# Patient Record
Sex: Male | Born: 1975
Health system: Southern US, Community
[De-identification: ages and names within clinical notes are randomized; demographics above are authoritative.]

## PROBLEM LIST (undated history)

## (undated) HISTORY — PX: ANKLE SURGERY: SHX546

## (undated) HISTORY — PX: RETINAL DETACHMENT SURGERY: SHX105

## (undated) HISTORY — PX: HERNIA REPAIR: SHX51

## (undated) HISTORY — PX: APPENDECTOMY: SHX54

## (undated) HISTORY — PX: ELBOW FRACTURE SURGERY: SHX616

---

## 1999-10-18 ENCOUNTER — Emergency Department (HOSPITAL_COMMUNITY): Admission: EM | Admit: 1999-10-18 | Discharge: 1999-10-18 | Payer: Self-pay | Admitting: Emergency Medicine

## 2007-06-12 ENCOUNTER — Emergency Department (HOSPITAL_COMMUNITY): Admission: EM | Admit: 2007-06-12 | Discharge: 2007-06-12 | Payer: Self-pay | Admitting: Family Medicine

## 2007-08-08 ENCOUNTER — Ambulatory Visit (HOSPITAL_COMMUNITY): Admission: RE | Admit: 2007-08-08 | Discharge: 2007-08-08 | Payer: Self-pay | Admitting: Sports Medicine

## 2008-04-27 ENCOUNTER — Emergency Department (HOSPITAL_COMMUNITY): Admission: EM | Admit: 2008-04-27 | Discharge: 2008-04-27 | Payer: Self-pay | Admitting: Emergency Medicine

## 2010-04-19 ENCOUNTER — Encounter: Payer: Self-pay | Admitting: Sports Medicine

## 2010-06-05 IMAGING — CR DG FINGER INDEX 2+V*R*
1 series · 1 of 1 positions shown · non-contrast
Comparison: None

CLINICAL DATA: Finger swelling and warmth following splinter
removal yesterday.

RIGHT INDEX FINGER 2+V

[view not recorded]
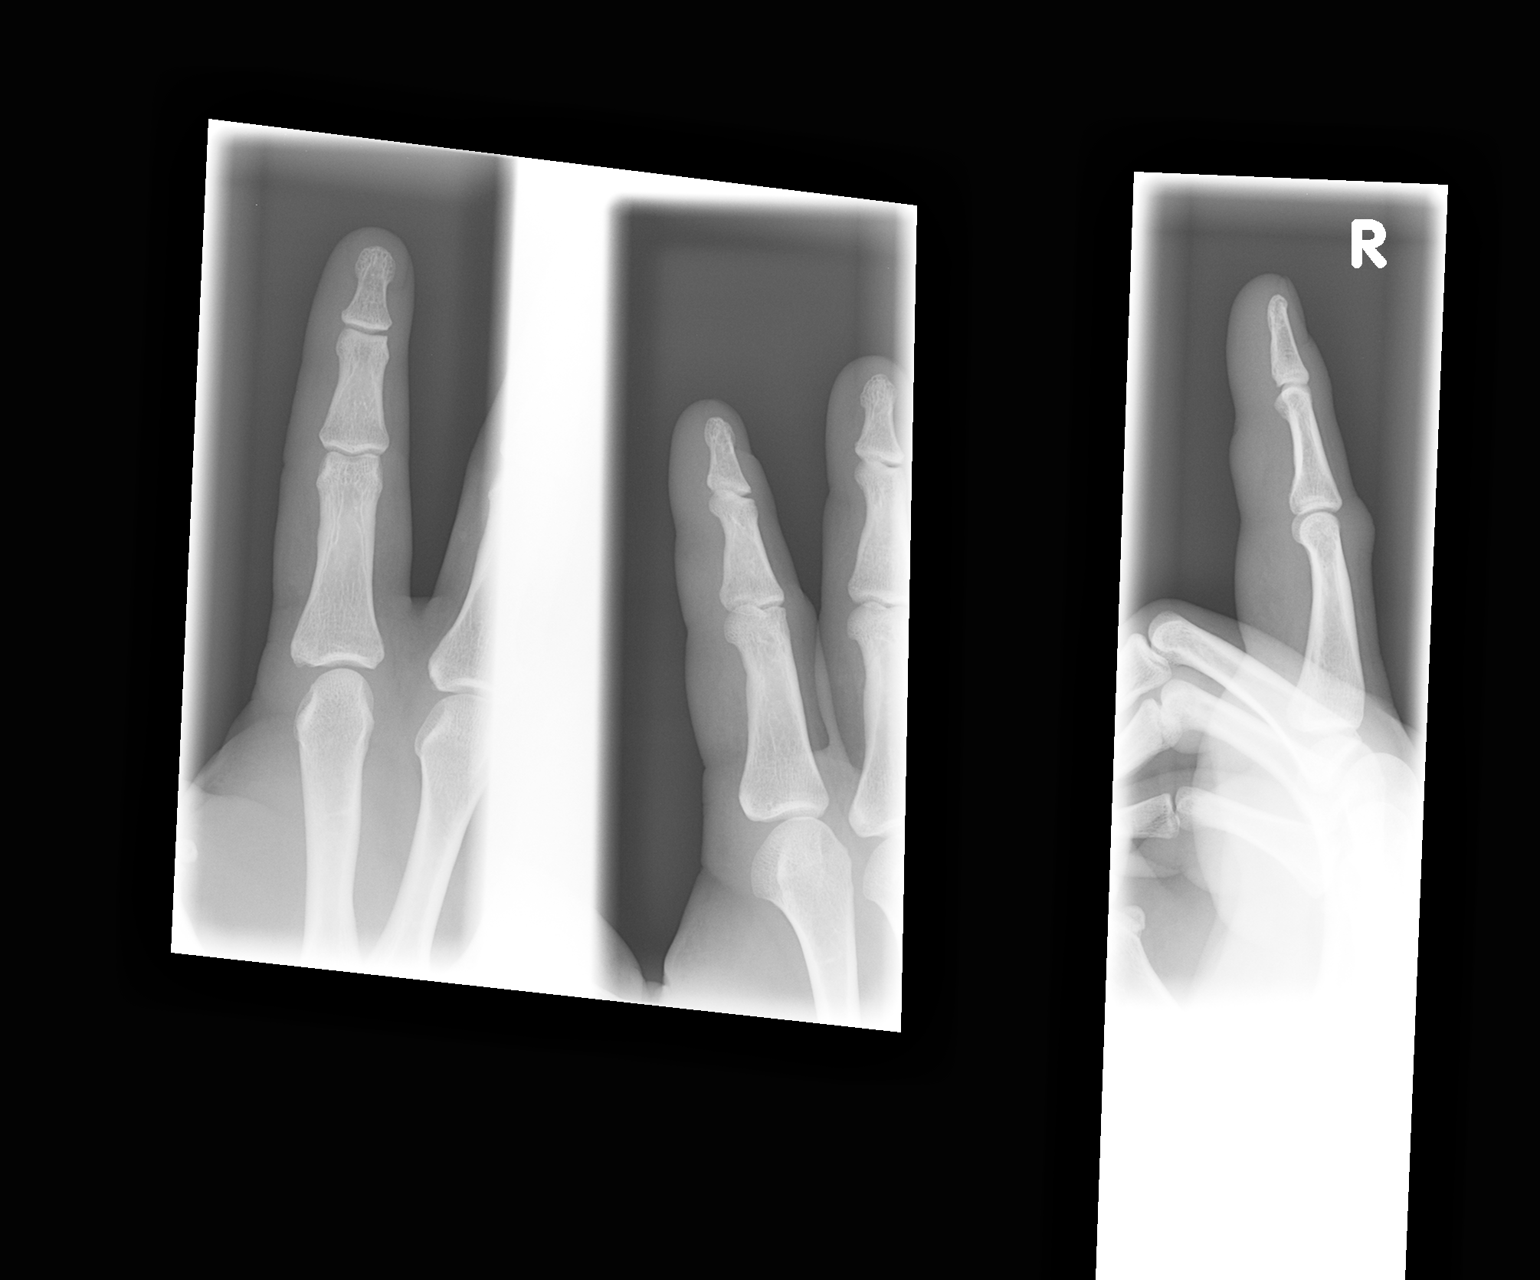

[1 of 1 positions shown; findings below may reference images not displayed]

FINDINGS: There is diffuse soft tissue swelling, especially
proximally.  No foreign body, soft tissue emphysema or bone
destruction is identified.  There is no evidence of acute fracture
or dislocation.
IMPRESSION: Soft tissue swelling without evidence of residual foreign body or
osteomyelitis.  Soft tissue infection is not excluded.

## 2015-07-15 DIAGNOSIS — Z9109 Other allergy status, other than to drugs and biological substances: Secondary | ICD-10-CM | POA: Diagnosis not present

## 2015-07-15 DIAGNOSIS — R51 Headache: Secondary | ICD-10-CM | POA: Diagnosis not present

## 2015-07-15 DIAGNOSIS — M542 Cervicalgia: Secondary | ICD-10-CM | POA: Diagnosis not present

## 2015-07-18 DIAGNOSIS — R51 Headache: Secondary | ICD-10-CM | POA: Diagnosis not present

## 2016-01-28 DIAGNOSIS — H25812 Combined forms of age-related cataract, left eye: Secondary | ICD-10-CM | POA: Diagnosis not present

## 2016-01-28 DIAGNOSIS — H04123 Dry eye syndrome of bilateral lacrimal glands: Secondary | ICD-10-CM | POA: Diagnosis not present

## 2016-01-28 DIAGNOSIS — H25041 Posterior subcapsular polar age-related cataract, right eye: Secondary | ICD-10-CM | POA: Diagnosis not present

## 2016-08-30 DIAGNOSIS — S80862A Insect bite (nonvenomous), left lower leg, initial encounter: Secondary | ICD-10-CM | POA: Diagnosis not present

## 2016-08-30 DIAGNOSIS — R21 Rash and other nonspecific skin eruption: Secondary | ICD-10-CM | POA: Diagnosis not present

## 2016-09-06 DIAGNOSIS — L301 Dyshidrosis [pompholyx]: Secondary | ICD-10-CM | POA: Diagnosis not present

## 2016-09-22 DIAGNOSIS — H9201 Otalgia, right ear: Secondary | ICD-10-CM | POA: Diagnosis not present

## 2017-02-02 DIAGNOSIS — H25041 Posterior subcapsular polar age-related cataract, right eye: Secondary | ICD-10-CM | POA: Diagnosis not present

## 2017-02-02 DIAGNOSIS — H04123 Dry eye syndrome of bilateral lacrimal glands: Secondary | ICD-10-CM | POA: Diagnosis not present

## 2017-02-02 DIAGNOSIS — H25812 Combined forms of age-related cataract, left eye: Secondary | ICD-10-CM | POA: Diagnosis not present

## 2017-03-01 DIAGNOSIS — J0120 Acute ethmoidal sinusitis, unspecified: Secondary | ICD-10-CM | POA: Diagnosis not present

## 2017-05-02 DIAGNOSIS — Z0000 Encounter for general adult medical examination without abnormal findings: Secondary | ICD-10-CM | POA: Diagnosis not present

## 2017-05-02 DIAGNOSIS — E663 Overweight: Secondary | ICD-10-CM | POA: Diagnosis not present

## 2017-05-02 DIAGNOSIS — R140 Abdominal distension (gaseous): Secondary | ICD-10-CM | POA: Diagnosis not present

## 2017-05-02 DIAGNOSIS — Z13220 Encounter for screening for lipoid disorders: Secondary | ICD-10-CM | POA: Diagnosis not present

## 2017-05-02 DIAGNOSIS — Z23 Encounter for immunization: Secondary | ICD-10-CM | POA: Diagnosis not present

## 2017-05-02 DIAGNOSIS — G479 Sleep disorder, unspecified: Secondary | ICD-10-CM | POA: Diagnosis not present

## 2018-01-27 DIAGNOSIS — K118 Other diseases of salivary glands: Secondary | ICD-10-CM | POA: Diagnosis not present

## 2018-01-28 ENCOUNTER — Emergency Department (HOSPITAL_COMMUNITY)
Admission: EM | Admit: 2018-01-28 | Discharge: 2018-01-28 | Disposition: A | Discharge status: Home / Self Care | Payer: BLUE CROSS/BLUE SHIELD | Attending: Emergency Medicine | Admitting: Emergency Medicine

## 2018-01-28 ENCOUNTER — Emergency Department (HOSPITAL_COMMUNITY): Payer: BLUE CROSS/BLUE SHIELD

## 2018-01-28 ENCOUNTER — Encounter (HOSPITAL_COMMUNITY): Payer: Self-pay | Admitting: Emergency Medicine

## 2018-01-28 DIAGNOSIS — Z87891 Personal history of nicotine dependence: Secondary | ICD-10-CM | POA: Insufficient documentation

## 2018-01-28 DIAGNOSIS — K1120 Sialoadenitis, unspecified: Secondary | ICD-10-CM | POA: Insufficient documentation

## 2018-01-28 DIAGNOSIS — R600 Localized edema: Secondary | ICD-10-CM | POA: Diagnosis not present

## 2018-01-28 DIAGNOSIS — K118 Other diseases of salivary glands: Secondary | ICD-10-CM | POA: Diagnosis not present

## 2018-01-28 LAB — CBC WITH DIFFERENTIAL/PLATELET
ABS IMMATURE GRANULOCYTES: 0.04 10*3/uL (ref 0.00–0.07)
BASOS PCT: 0 %
Basophils Absolute: 0 10*3/uL (ref 0.0–0.1)
EOS ABS: 0.3 10*3/uL (ref 0.0–0.5)
Eosinophils Relative: 3 %
HEMATOCRIT: 45 % (ref 39.0–52.0)
Hemoglobin: 14.1 g/dL (ref 13.0–17.0)
IMMATURE GRANULOCYTES: 0 %
LYMPHS ABS: 2.1 10*3/uL (ref 0.7–4.0)
Lymphocytes Relative: 19 %
MCH: 29.2 pg (ref 26.0–34.0)
MCHC: 31.3 g/dL (ref 30.0–36.0)
MCV: 93.2 fL (ref 80.0–100.0)
MONO ABS: 1.2 10*3/uL — AB (ref 0.1–1.0)
MONOS PCT: 11 %
NEUTROS ABS: 7.3 10*3/uL (ref 1.7–7.7)
NEUTROS PCT: 67 %
Platelets: 221 10*3/uL (ref 150–400)
RBC: 4.83 MIL/uL (ref 4.22–5.81)
RDW: 11.6 % (ref 11.5–15.5)
WBC: 11 10*3/uL — ABNORMAL HIGH (ref 4.0–10.5)
nRBC: 0 % (ref 0.0–0.2)

## 2018-01-28 LAB — BASIC METABOLIC PANEL
ANION GAP: 7 (ref 5–15)
BUN: 11 mg/dL (ref 6–20)
CHLORIDE: 108 mmol/L (ref 98–111)
CO2: 26 mmol/L (ref 22–32)
Calcium: 8.7 mg/dL — ABNORMAL LOW (ref 8.9–10.3)
Creatinine, Ser: 0.9 mg/dL (ref 0.61–1.24)
GFR calc Af Amer: >60 mL/min (ref >60)
GFR calc non Af Amer: >60 mL/min (ref >60)
GLUCOSE: 94 mg/dL (ref 70–99)
POTASSIUM: 3.4 mmol/L — AB (ref 3.5–5.1)
Sodium: 141 mmol/L (ref 135–145)

## 2018-01-28 MED ORDER — KETOROLAC TROMETHAMINE 15 MG/ML IJ SOLN
15.0000 mg | Freq: Once | INTRAMUSCULAR | Status: AC
  Administered 2018-01-28: 15 mg via INTRAVENOUS
  Filled 2018-01-28: qty 1

## 2018-01-28 MED ORDER — IOHEXOL 300 MG/ML  SOLN
75.0000 mL | Freq: Once | INTRAMUSCULAR | Status: AC | PRN
  Administered 2018-01-28: 75 mL via INTRAVENOUS

## 2018-01-28 MED ORDER — CEPHALEXIN 500 MG PO CAPS: 500.0000 mg | ORAL_CAPSULE | Freq: Four times a day (QID) | ORAL | 0 refills | Status: AC

## 2018-01-28 NOTE — ED Notes (Signed)
The pt is c/o pain in lt jaw just under since Wednesday.  He saw his doctor and was told it was a  Stone in one of the glands

## 2018-01-28 NOTE — ED Notes (Signed)
Pt returns room radiology.

## 2018-01-28 NOTE — ED Triage Notes (Signed)
Reports being seen by family physician on Friday and was told possible blocked salivary gland on left side.  Continues to have swelling and pain.

## 2018-01-28 NOTE — Discharge Instructions (Signed)
Continue to suck on sour candy place warm rags or heating pad on the area and massage it.  It would be important for you to follow-up with ENT or your regular doctor by Tuesday or Wednesday of next week or if it is not getting any better.  If it starts to get worse you start running a high temperature return to the emergency room

## 2018-01-28 NOTE — ED Provider Notes (Signed)
[facility] EMERGENCY DEPARTMENT Provider Note   CSN: [id number] Arrival date & time: 01/28/18  7829     History   Chief Complaint Chief Complaint  Patient presents with  . Facial Swelling    HPI Cody Neal is a 42 y.o. male.  Patient is a 42 year old male with no significant past medical history presenting today with worsening swelling and pain in the left side of his neck.  Patient states symptoms started on Wednesday when he noticed a small tender area in his left neck.  It continued to swell and he saw his doctor yesterday and was told that he had a salivary stone or possible parotitis.  Patient denies fever, difficulty breathing or difficulty swallowing.  Swallowing does not make his pain worse.  Patient has not had any recent foreign travel no sick contacts and was immunized as a child.  Patient does admit to using chewing tobacco and smoking intermittently for years but has quit within the last 6 months  The history is provided by the patient.    History reviewed. No pertinent past medical history.  There are no active problems to display for this patient.   Past Surgical History:  Procedure Laterality Date  . ANKLE SURGERY Left   . APPENDECTOMY    . ELBOW FRACTURE SURGERY Left   . HERNIA REPAIR    . RETINAL DETACHMENT SURGERY Left         Home Medications    Prior to Admission medications   Medication Sig Start Date End Date Taking? Authorizing Provider  ibuprofen (ADVIL,MOTRIN) 200 MG tablet Take 200 mg by mouth every 6 (six) hours as needed for mild pain.   Yes [provider]    Family History No family history on file.  Social History Social History   Tobacco Use  . Smoking status: Former Games developer  . Smokeless tobacco: Former Engineer, water Use Topics  . Alcohol use: Yes    Comment: occasionally  . Drug use: Yes    Types: Marijuana     Allergies   Penicillins   Review of Systems Review of Systems  All  other systems reviewed and are negative.    Physical Exam Updated Vital Signs BP (!) 148/83 (BP Location: Right Arm)   Pulse 64   Temp 98.7 F (37.1 C) (Oral)   Resp 18   Ht 6' (1.829 m)   Wt 106.1 kg   SpO2 100%   BMI 31.74 kg/m   Physical Exam  Constitutional: He is oriented to person, place, and time. He appears well-developed and well-nourished. No distress.  HENT:  Head: Normocephalic and atraumatic.  Mouth/Throat: Oropharynx is clear and moist and mucous membranes are normal. No oropharyngeal exudate, posterior oropharyngeal edema or posterior oropharyngeal erythema.    Eyes: Pupils are equal, round, and reactive to light. Conjunctivae and EOM are normal.  Neck: Normal range of motion. Neck supple. No spinous process tenderness present. Normal range of motion present.    Cardiovascular: Normal rate, regular rhythm and intact distal pulses.  No murmur heard. Pulmonary/Chest: Effort normal and breath sounds normal. No respiratory distress. He has no wheezes. He has no rales.  Musculoskeletal: Normal range of motion. He exhibits no edema or tenderness.  Neurological: He is alert and oriented to person, place, and time.  Skin: Skin is warm and dry. No rash noted. No erythema.  Psychiatric: He has a normal mood and affect. His behavior is normal.  Nursing note and vitals reviewed.  ED Treatments / Results  Labs (all labs ordered are listed, but only abnormal results are displayed) Labs Reviewed  CBC WITH DIFFERENTIAL/PLATELET - Abnormal; Notable for the following components:      Result Value   WBC 11.0 (*)    Monocytes Absolute 1.2 (*)    All other components within normal limits  BASIC METABOLIC PANEL - Abnormal; Notable for the following components:   Potassium 3.4 (*)    Calcium 8.7 (*)    All other components within normal limits    EKG None  Radiology Ct Soft Tissue Neck W Contrast  Result Date: 01/28/2018 CLINICAL DATA:  Neck swelling and pain.  Salivary gland obstruction. Neck mass, solitary, afebrile. EXAM: CT NECK WITH CONTRAST TECHNIQUE: Multidetector CT imaging of the neck was performed using the standard protocol following the bolus administration of intravenous contrast. CONTRAST:  75mL OMNIPAQUE IOHEXOL 300 MG/ML  SOLN COMPARISON:  None. FINDINGS: Pharynx and larynx: No focal mucosal or submucosal lesions are present. The nasopharynx is within normal. Soft palate is unremarkable. Tongue base is normal. Epiglottis is within. The hypopharynx is clear. Vocal cords are midline and symmetric. The trachea is unremarkable. Salivary glands: The right parotid gland and bilateral submandibular glands are within limits. Ill-defined inflammatory changes are present at the tail of the left parotid gland. There is no significant duct dilation. There is some stranding and fluid. Accessory parotid tissue is present on the left. Thyroid: Normal. Lymph nodes: Inflammatory left level 2 lymph nodes are present. No significant adenopathy is present. Vascular: Negative. Limited intracranial: Limited imaging of the abdomen is unremarkable. Visualized orbits: Left-sided scleral banding is noted. Globes and orbits are otherwise within normal limits. Mastoids and visualized paranasal sinuses: There is diffuse opacification of ethmoid air cells bilaterally. Mild mucosal thickening is present in the maxillary and sphenoid sinuses bilaterally. Mastoid air cells are clear. Skeleton: No straightening of the normal cervical lordosis. Vertebral body heights alignment are maintained. No focal lytic or blastic lesions are present. Upper chest: The lung apices are clear without focal nodule, mass, or airspace disease. IMPRESSION: 1. Inflammatory changes about the tail of the left parotid gland are nonspecific. No discrete mass lesion or obstructive disease is present. May represent a viral sialadenitis with sequela of recent obstructive disease. 2. Inflammatory lymph nodes are present  without significant adenopathy. 3. Otherwise unremarkable CT of the neck. Electronically Signed   By: Marin Roberts M.D.   On: 01/28/2018 09:57    Procedures Procedures (including critical care time)  Medications Ordered in ED Medications - No data to display   Initial Impression / Assessment and Plan / ED Course  I have reviewed the triage vital signs and the nursing notes.  Pertinent labs & imaging results that were available during my care of the patient were reviewed by me and considered in my medical decision making (see chart for details).     Patient presenting today with a tender swollen mass in the left anterior neck concerning for possible parotitis versus parotid gland stone.  No evidence of salivary stone present.  Patient has no infectious symptoms concerning for mumps.  10:41 AM CT shows sialoadenitis without evidence of obstruction at this time.  Also some minimal surrounding lymph nodes.  Patient has a leukocytosis of 11,000 and otherwise normal labs.  Findings discussed with the patient.  Discussed continued supportive care and will add on Keflex in case of an early bacterial component.  No evidence of mumps at this time.  However could  still be a viral etiology.  Final Clinical Impressions(s) / ED Diagnoses   Final diagnoses:  Sialadenitis    ED Discharge Orders         Ordered    cephALEXin (KEFLEX) 500 MG capsule  4 times daily     01/28/18 1039           Gwyneth Sprout, MD 01/28/18 1041

## 2018-01-28 NOTE — ED Notes (Signed)
Pt stable, ambulatory, states understanding of discharge instructions 

## 2018-01-28 NOTE — ED Notes (Signed)
Patient transported to CT 

## 2018-02-02 DIAGNOSIS — H25041 Posterior subcapsular polar age-related cataract, right eye: Secondary | ICD-10-CM | POA: Diagnosis not present

## 2018-02-02 DIAGNOSIS — H04123 Dry eye syndrome of bilateral lacrimal glands: Secondary | ICD-10-CM | POA: Diagnosis not present

## 2018-02-02 DIAGNOSIS — H25812 Combined forms of age-related cataract, left eye: Secondary | ICD-10-CM | POA: Diagnosis not present

## 2018-06-07 DIAGNOSIS — Z9109 Other allergy status, other than to drugs and biological substances: Secondary | ICD-10-CM | POA: Diagnosis not present

## 2018-06-07 DIAGNOSIS — E663 Overweight: Secondary | ICD-10-CM | POA: Diagnosis not present

## 2018-06-07 DIAGNOSIS — Z0000 Encounter for general adult medical examination without abnormal findings: Secondary | ICD-10-CM | POA: Diagnosis not present

## 2018-06-07 DIAGNOSIS — Z6833 Body mass index (BMI) 33.0-33.9, adult: Secondary | ICD-10-CM | POA: Diagnosis not present

## 2018-09-18 DIAGNOSIS — R3121 Asymptomatic microscopic hematuria: Secondary | ICD-10-CM | POA: Diagnosis not present

## 2018-09-22 DIAGNOSIS — R3121 Asymptomatic microscopic hematuria: Secondary | ICD-10-CM | POA: Diagnosis not present

## 2018-09-22 DIAGNOSIS — N289 Disorder of kidney and ureter, unspecified: Secondary | ICD-10-CM | POA: Diagnosis not present

## 2018-09-28 DIAGNOSIS — N281 Cyst of kidney, acquired: Secondary | ICD-10-CM | POA: Diagnosis not present

## 2018-09-28 DIAGNOSIS — R3121 Asymptomatic microscopic hematuria: Secondary | ICD-10-CM | POA: Diagnosis not present

## 2020-03-07 IMAGING — CT CT NECK W/ CM
3 of 4 series · 13 of 33 positions shown, 16 images · IV contrast (APPLIED)
Comparison: None.

CLINICAL DATA: Neck swelling and pain. Salivary gland obstruction.
Neck mass, solitary, afebrile.

EXAM:
CT NECK WITH CONTRAST
TECHNIQUE: Multidetector CT imaging of the neck was performed using the
standard protocol following the bolus administration of intravenous
contrast.
CONTRAST:  75mL OMNIPAQUE IOHEXOL 300 MG/ML  SOLN

[Series 3: neck 2.0 i31s 3 · axial · 0.64mm/px · z∈[-259,-83]mm · 5 of 133 slices shown, 7 images]
[im 23/133  soft-tissue]
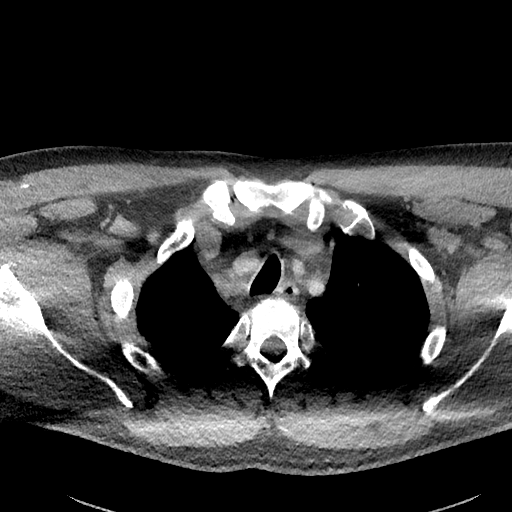
[im 23/133  bone]
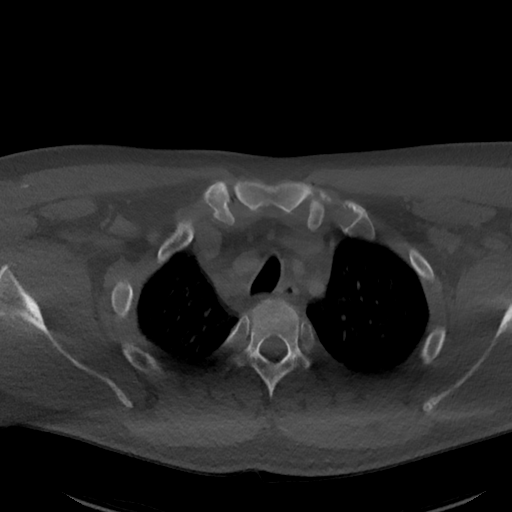
[im 45/133  bone]
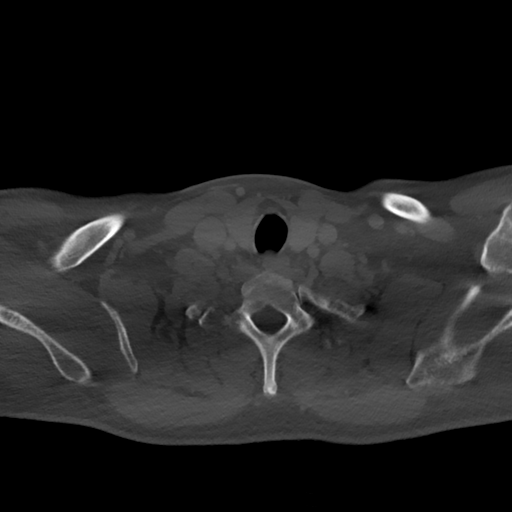
[im 67/133  bone]
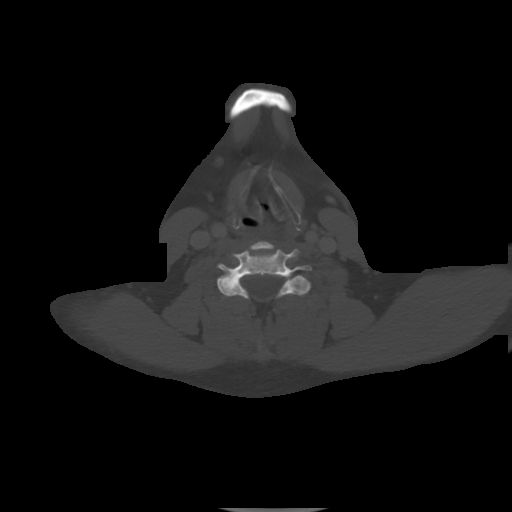
[im 89/133  bone]
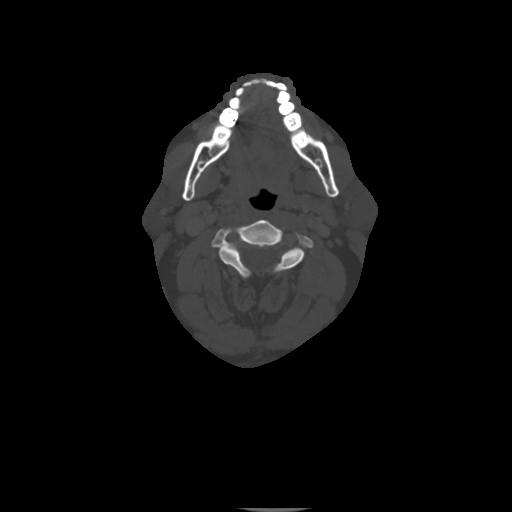
[im 111/133  soft-tissue]
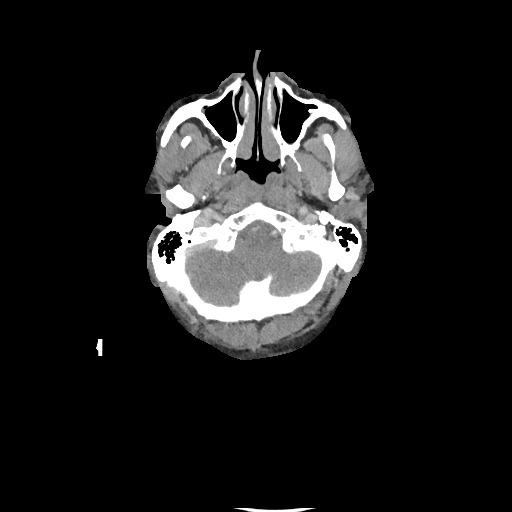
[im 111/133  bone]
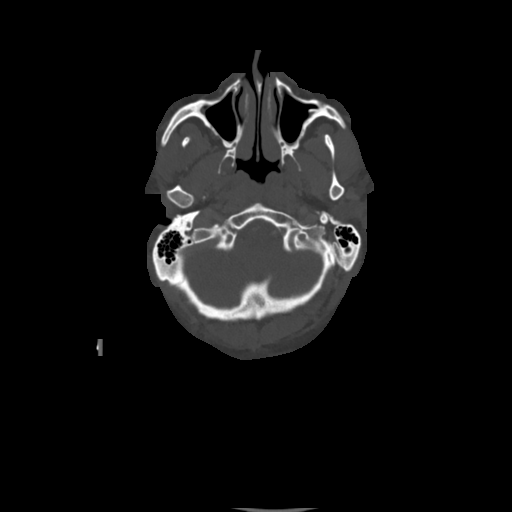

[Series 6: coronal st · coronal · 0.52mm/px · 3 of 154 slices shown]
[im 31/154  bone]
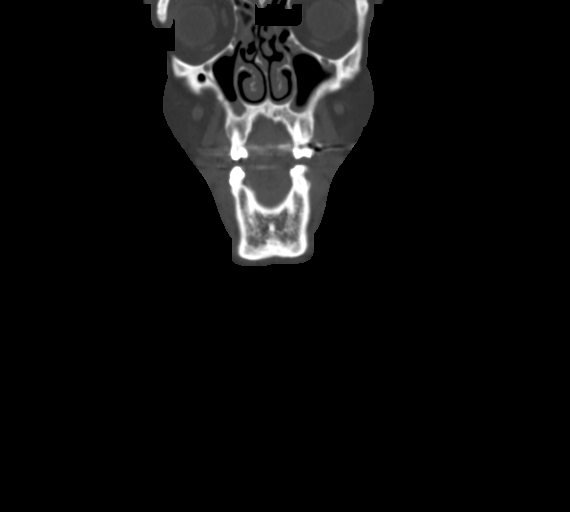
[im 62/154  bone]
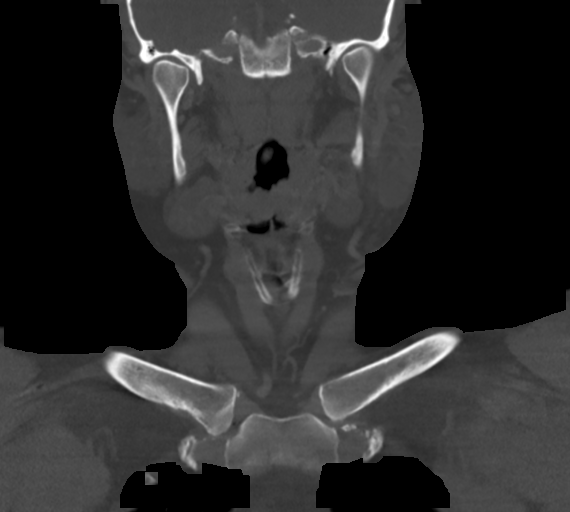
[im 92/154  bone]
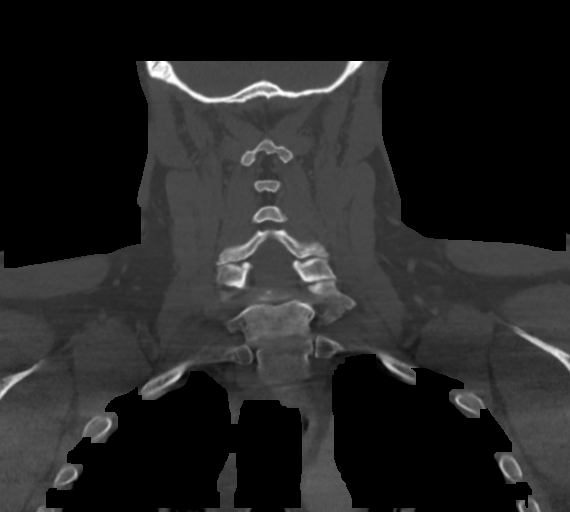

[Series 7: sagittal st · sagittal · 0.52mm/px · 5 of 119 slices shown, 6 images]
[im 40/119  bone]
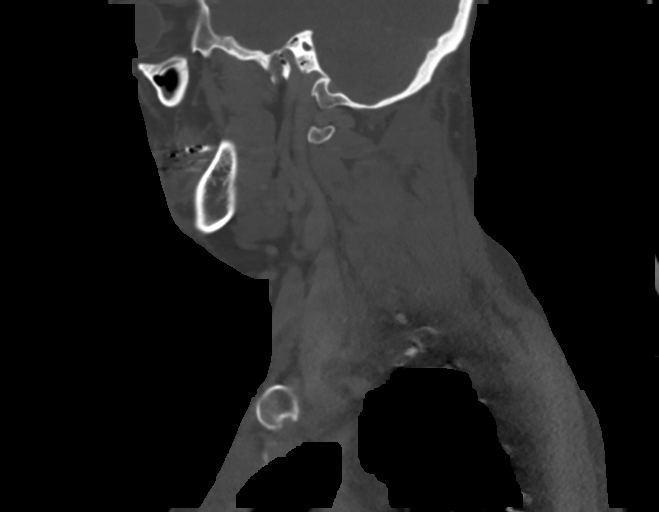
[im 50/119  bone]
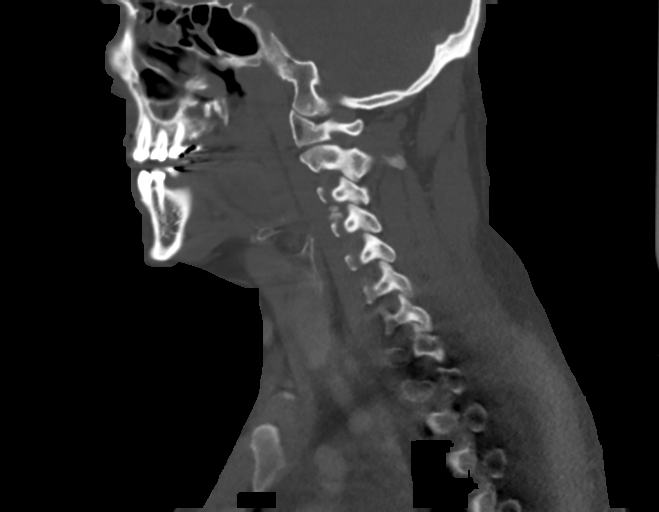
[im 60/119  soft-tissue]
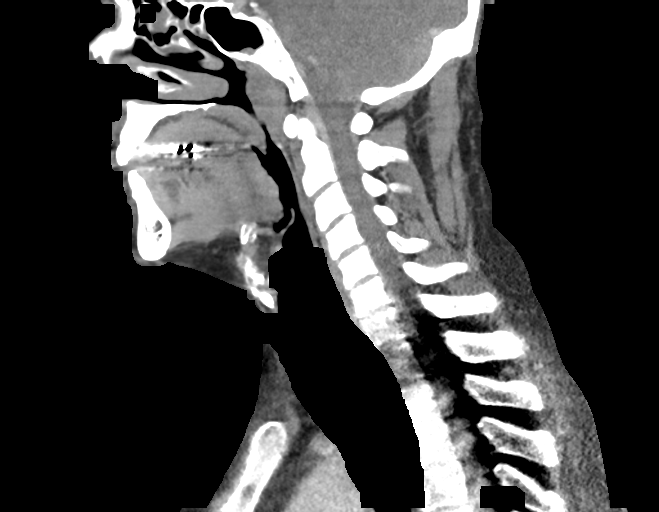
[im 60/119  bone]
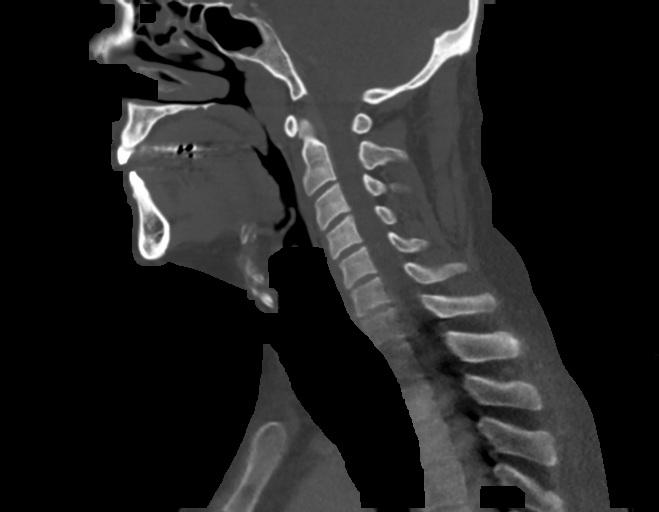
[im 69/119  bone]
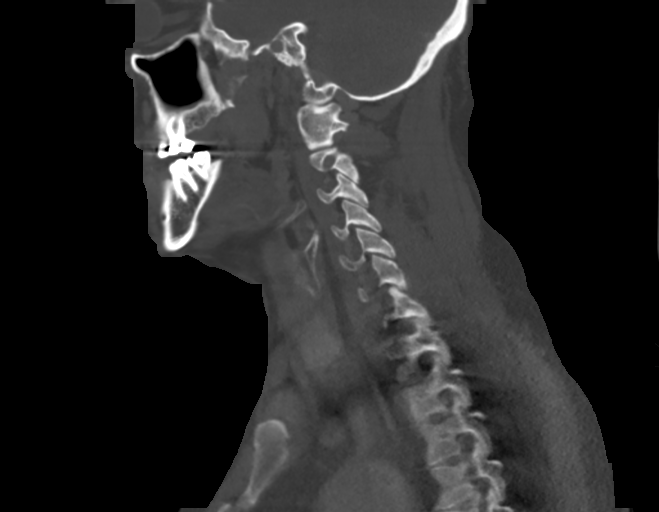
[im 79/119  bone]
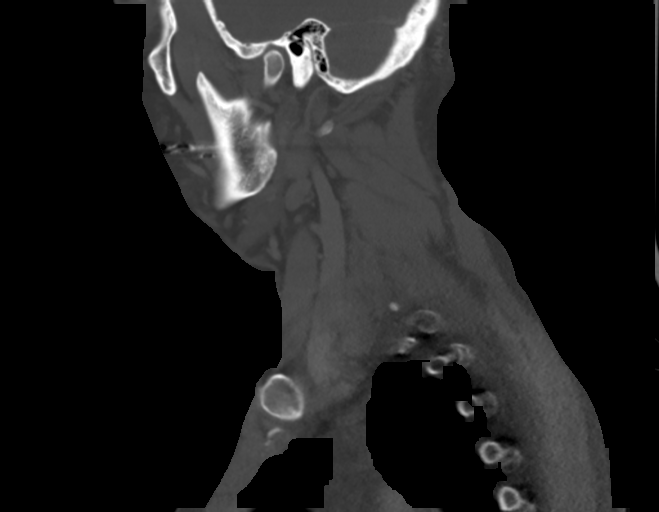

[13 of 33 positions shown; findings below may reference images not displayed]

FINDINGS: Pharynx and larynx: No focal mucosal or submucosal lesions are
present. The nasopharynx is within normal. Soft palate is
unremarkable. Tongue base is normal. Epiglottis is within. The
hypopharynx is clear. Vocal cords are midline and symmetric. The
trachea is unremarkable.

Salivary glands: The right parotid gland and bilateral submandibular
glands are within limits. Ill-defined inflammatory changes are
present at the tail of the left parotid gland. There is no
significant duct dilation. There is some stranding and fluid.
Accessory parotid tissue is present on the left.

Thyroid: Normal.

Lymph nodes: Inflammatory left level 2 lymph nodes are present. No
significant adenopathy is present.

Vascular: Negative.

Limited intracranial: Limited imaging of the abdomen is
unremarkable.

Visualized orbits: Left-sided scleral banding is noted. Globes and
orbits are otherwise within normal limits.

Mastoids and visualized paranasal sinuses: There is diffuse
opacification of ethmoid air cells bilaterally. Mild mucosal
thickening is present in the maxillary and sphenoid sinuses
bilaterally. Mastoid air cells are clear.

Skeleton: No straightening of the normal cervical lordosis.
Vertebral body heights alignment are maintained. No focal lytic or
blastic lesions are present.

Upper chest: The lung apices are clear without focal nodule, mass,
or airspace disease.
IMPRESSION: 1. Inflammatory changes about the tail of the left parotid gland are
nonspecific. No discrete mass lesion or obstructive disease is
present. May represent a viral sialadenitis with sequela of recent
obstructive disease.
2. Inflammatory lymph nodes are present without significant
adenopathy.
3. Otherwise unremarkable CT of the neck.

## 2020-07-31 DIAGNOSIS — Z0000 Encounter for general adult medical examination without abnormal findings: Secondary | ICD-10-CM | POA: Diagnosis not present

## 2020-07-31 DIAGNOSIS — Z13220 Encounter for screening for lipoid disorders: Secondary | ICD-10-CM | POA: Diagnosis not present

## 2021-08-14 DIAGNOSIS — Z13220 Encounter for screening for lipoid disorders: Secondary | ICD-10-CM | POA: Diagnosis not present

## 2021-08-14 DIAGNOSIS — Z6835 Body mass index (BMI) 35.0-35.9, adult: Secondary | ICD-10-CM | POA: Diagnosis not present

## 2021-08-14 DIAGNOSIS — E663 Overweight: Secondary | ICD-10-CM | POA: Diagnosis not present

## 2021-08-14 DIAGNOSIS — M25562 Pain in left knee: Secondary | ICD-10-CM | POA: Diagnosis not present

## 2021-08-14 DIAGNOSIS — M2550 Pain in unspecified joint: Secondary | ICD-10-CM | POA: Diagnosis not present

## 2021-08-14 DIAGNOSIS — Z0000 Encounter for general adult medical examination without abnormal findings: Secondary | ICD-10-CM | POA: Diagnosis not present

## 2021-08-14 DIAGNOSIS — R82998 Other abnormal findings in urine: Secondary | ICD-10-CM | POA: Diagnosis not present

## 2021-08-14 DIAGNOSIS — M25561 Pain in right knee: Secondary | ICD-10-CM | POA: Diagnosis not present

## 2021-08-27 DIAGNOSIS — H52223 Regular astigmatism, bilateral: Secondary | ICD-10-CM | POA: Diagnosis not present

## 2021-08-27 DIAGNOSIS — H25812 Combined forms of age-related cataract, left eye: Secondary | ICD-10-CM | POA: Diagnosis not present

## 2021-08-27 DIAGNOSIS — H524 Presbyopia: Secondary | ICD-10-CM | POA: Diagnosis not present

## 2021-08-27 DIAGNOSIS — H04123 Dry eye syndrome of bilateral lacrimal glands: Secondary | ICD-10-CM | POA: Diagnosis not present

## 2021-08-27 DIAGNOSIS — H5213 Myopia, bilateral: Secondary | ICD-10-CM | POA: Diagnosis not present

## 2022-10-20 DIAGNOSIS — Z13220 Encounter for screening for lipoid disorders: Secondary | ICD-10-CM | POA: Diagnosis not present

## 2022-10-20 DIAGNOSIS — E663 Overweight: Secondary | ICD-10-CM | POA: Diagnosis not present

## 2022-10-20 DIAGNOSIS — R0789 Other chest pain: Secondary | ICD-10-CM | POA: Diagnosis not present

## 2022-10-20 DIAGNOSIS — Z0000 Encounter for general adult medical examination without abnormal findings: Secondary | ICD-10-CM | POA: Diagnosis not present

## 2022-10-20 DIAGNOSIS — G479 Sleep disorder, unspecified: Secondary | ICD-10-CM | POA: Diagnosis not present

## 2022-10-22 DIAGNOSIS — H5213 Myopia, bilateral: Secondary | ICD-10-CM | POA: Diagnosis not present

## 2022-10-22 DIAGNOSIS — H04123 Dry eye syndrome of bilateral lacrimal glands: Secondary | ICD-10-CM | POA: Diagnosis not present

## 2022-10-22 DIAGNOSIS — H524 Presbyopia: Secondary | ICD-10-CM | POA: Diagnosis not present

## 2022-10-22 DIAGNOSIS — H26132 Total traumatic cataract, left eye: Secondary | ICD-10-CM | POA: Diagnosis not present

## 2022-10-22 DIAGNOSIS — H52221 Regular astigmatism, right eye: Secondary | ICD-10-CM | POA: Diagnosis not present

## 2023-12-13 DIAGNOSIS — R0789 Other chest pain: Secondary | ICD-10-CM | POA: Diagnosis not present

## 2023-12-13 DIAGNOSIS — R030 Elevated blood-pressure reading, without diagnosis of hypertension: Secondary | ICD-10-CM | POA: Diagnosis not present

## 2023-12-13 DIAGNOSIS — Z0000 Encounter for general adult medical examination without abnormal findings: Secondary | ICD-10-CM | POA: Diagnosis not present

## 2023-12-13 DIAGNOSIS — E663 Overweight: Secondary | ICD-10-CM | POA: Diagnosis not present

## 2023-12-14 DIAGNOSIS — S62606A Fracture of unspecified phalanx of right little finger, initial encounter for closed fracture: Secondary | ICD-10-CM | POA: Diagnosis not present

## 2024-01-24 DIAGNOSIS — R030 Elevated blood-pressure reading, without diagnosis of hypertension: Secondary | ICD-10-CM | POA: Diagnosis not present

## 2024-01-24 DIAGNOSIS — E663 Overweight: Secondary | ICD-10-CM | POA: Diagnosis not present

## 2024-01-26 DIAGNOSIS — K640 First degree hemorrhoids: Secondary | ICD-10-CM | POA: Diagnosis not present

## 2024-01-26 DIAGNOSIS — K5730 Diverticulosis of large intestine without perforation or abscess without bleeding: Secondary | ICD-10-CM | POA: Diagnosis not present

## 2024-01-26 DIAGNOSIS — Z1211 Encounter for screening for malignant neoplasm of colon: Secondary | ICD-10-CM | POA: Diagnosis not present
